# Patient Record
Sex: Male | Born: 1995 | Race: White | Hispanic: No | Marital: Single | Smoking: Never smoker
Health system: Southern US, Community
[De-identification: ages and names within clinical notes are randomized; demographics above are authoritative.]

---

## 2016-03-02 ENCOUNTER — Emergency Department
Admission: EM | Admit: 2016-03-02 | Discharge: 2016-03-02 | Disposition: A | Discharge status: Home / Self Care | Payer: Managed Care, Other (non HMO) | Attending: Emergency Medicine | Admitting: Emergency Medicine

## 2016-03-02 ENCOUNTER — Encounter: Payer: Self-pay | Admitting: Emergency Medicine

## 2016-03-02 ENCOUNTER — Emergency Department: Payer: Managed Care, Other (non HMO)

## 2016-03-02 DIAGNOSIS — R0602 Shortness of breath: Secondary | ICD-10-CM | POA: Diagnosis present

## 2016-03-02 DIAGNOSIS — J069 Acute upper respiratory infection, unspecified: Secondary | ICD-10-CM | POA: Diagnosis not present

## 2016-03-02 DIAGNOSIS — J982 Interstitial emphysema: Secondary | ICD-10-CM | POA: Diagnosis not present

## 2016-03-02 LAB — CBC WITH DIFFERENTIAL/PLATELET
BASOS ABS: 0.1 10*3/uL (ref 0–0.1)
Basophils Relative: 1 %
EOS ABS: 0.1 10*3/uL (ref 0–0.7)
EOS PCT: 1 %
HCT: 45.4 % (ref 40.0–52.0)
Hemoglobin: 15.3 g/dL (ref 13.0–18.0)
LYMPHS PCT: 5 %
Lymphs Abs: 0.7 10*3/uL — ABNORMAL LOW (ref 1.0–3.6)
MCH: 31 pg (ref 26.0–34.0)
MCHC: 33.8 g/dL (ref 32.0–36.0)
MCV: 91.8 fL (ref 80.0–100.0)
Monocytes Absolute: 0.8 10*3/uL (ref 0.2–1.0)
Monocytes Relative: 6 %
NEUTROS PCT: 87 %
Neutro Abs: 12 10*3/uL — ABNORMAL HIGH (ref 1.4–6.5)
PLATELETS: 207 10*3/uL (ref 150–440)
RBC: 4.95 MIL/uL (ref 4.40–5.90)
RDW: 12.6 % (ref 11.5–14.5)
WBC: 13.7 10*3/uL — AB (ref 3.8–10.6)

## 2016-03-02 LAB — POCT RAPID STREP A: STREPTOCOCCUS, GROUP A SCREEN (DIRECT): NEGATIVE

## 2016-03-02 LAB — BASIC METABOLIC PANEL
ANION GAP: 9 (ref 5–15)
BUN: 9 mg/dL (ref 6–20)
CO2: 24 mmol/L (ref 22–32)
Calcium: 9.1 mg/dL (ref 8.9–10.3)
Chloride: 102 mmol/L (ref 101–111)
Creatinine, Ser: 0.79 mg/dL (ref 0.61–1.24)
Glucose, Bld: 102 mg/dL — ABNORMAL HIGH (ref 65–99)
POTASSIUM: 3.8 mmol/L (ref 3.5–5.1)
SODIUM: 135 mmol/L (ref 135–145)

## 2016-03-02 MED ORDER — ALBUTEROL SULFATE HFA 108 (90 BASE) MCG/ACT IN AERS: 1.0000 | INHALATION_SPRAY | Freq: Four times a day (QID) | RESPIRATORY_TRACT | 0 refills | Status: AC | PRN

## 2016-03-02 MED ORDER — AZITHROMYCIN 250 MG PO TABS: ORAL_TABLET | ORAL | 0 refills | Status: AC

## 2016-03-02 MED ORDER — IPRATROPIUM-ALBUTEROL 0.5-2.5 (3) MG/3ML IN SOLN
3.0000 mL | Freq: Once | RESPIRATORY_TRACT | Status: AC
  Administered 2016-03-02: 3 mL via RESPIRATORY_TRACT
  Filled 2016-03-02: qty 3

## 2016-03-02 NOTE — ED Notes (Signed)
See triage note  States he has not felt well for about 3 days ..nasal congestion for couple of days   Then woke up with some chest congestion this am   Feels like it is hard to breath.. No resp distress noted on arrival  Afebrile on arrival

## 2016-03-02 NOTE — ED Provider Notes (Signed)
[facility] Emergency Department Provider Note  ____________________________________________  Time seen: Approximately 2:24 PM  I have reviewed the triage vital signs and the nursing notes.   HISTORY  Chief Complaint Facial Pain    HPI Timothy Wong is a 20 y.o. male, NAD, presents to the emergency department via EMS with 3 day history of nasal congestion, sinus pressure, sore throat, wheezing and shortness of breath. States he went to a [facility] to be evaluated for the symptoms this morning. Blood pressure was noted to be low therefore EMS was called. Patient was normotensive when EMS arrived but they continue to place and IV and the left antecubital fossa and transport the patient to the emergency department for further evaluation. Symptoms initially began with sinus pressure and nasal congestion on Saturday and seemed to worsen overnight. Has tried taking sudafed and mucinex with some relief of symptoms but no resolution. Last dose of sudafed was last night. He noticed that he started coughing more last night productive of clear sputum. Started to notice the sore throat this morning, which is irritated by PO intake, but hurts most with coughing. He also reports general fatigue, chest tightness, wheezing, shortness of breath, and trouble sleeping due to symptoms. He has not had a flu shot this year. When asked about sick contacts states, "There's been something going around campus". Patient does not have a history of asthma or seasonal allergies, although does admit the change in weather sometimes causes him to have upper respiratory symptoms. Patient denies abdominal pain, nausea, vomiting, diarrhea. No fevers, chills. No rashes.   History reviewed. No pertinent past medical history.  There are no active problems to display for this patient.   History reviewed. No pertinent surgical history.  Prior to Admission medications   Medication Sig Start Date End  Date Taking? Authorizing Provider  albuterol (PROVENTIL HFA;VENTOLIN HFA) 108 (90 Base) MCG/ACT inhaler Inhale 1-2 puffs into the lungs every 6 (six) hours as needed for wheezing or shortness of breath. 03/02/16   Jami L Hagler, PA-C  azithromycin (ZITHROMAX Z-PAK) 250 MG tablet Take 2 tablets (500 mg) on  Day 1,  followed by 1 tablet (250 mg) once daily on Days 2 through 5. 03/02/16   Jami L Hagler, PA-C    Allergies Review of patient's allergies indicates no known allergies.  No family history on file.  Social History Social History  Substance Use Topics  . Smoking status: Never Smoker  . Smokeless tobacco: Not on file  . Alcohol use Yes     Review of Systems  Constitutional: No fever/chills. Eyes: No visual changes. No discharge ENT: Positive sore throat, nasal congestion, rhinorrhea, sinus pressure. Negative for tinnitus or ear pain.  Cardiovascular: No chest pressure or palpitations. Respiratory: Positive for cough, shortness of breath, wheezing and chest tightness. Gastrointestinal: No abdominal pain.  No nausea, vomiting.  Musculoskeletal: Negative for neck/back pain or muscle aches.  Skin: Negative for rash. Neurological: Negative for headaches, focal weakness or numbness.   ____________________________________________   PHYSICAL EXAM:  VITAL SIGNS: ED Triage Vitals  Enc Vitals Group     BP 03/02/16 1358 132/88     Pulse Rate 03/02/16 1358 (!) 107     Resp 03/02/16 1358 20     Temp 03/02/16 1358 98.1 F (36.7 C)     Temp Source 03/02/16 1358 Oral     SpO2 03/02/16 1358 95 %     Weight 03/02/16 1400 125 lb (56.7 kg)  Height 03/02/16 1400 5\' 5"  (1.651 m)     Head Circumference --      Peak Flow --      Pain Score 03/02/16 1400 4     Pain Loc --      Pain Edu? --      Excl. in GC? --      Constitutional: Alert and oriented. Well appearing and in no acute distress. Eyes: Conjunctivae are normal Without icterus or injection.   Head: Atraumatic. ENT:       Ears: Canals and TMs normal bilaterally.       Nose: No congestion/rhinnorhea visible currently.      Mouth/Throat: Mucous membranes are moist. Pharynx without erythema, swelling, exudate. Clear postnasal drip. Uvula is midline and not edematous. Airway is patent. Neck: No stridor. No cervical spine tenderness to palpation. Supple, full ROM without pain or difficulty. Hematological/Lymphatic/Immunilogical: No preauricular, occipital, cervical, submandibular, submental, or supraclavicular lymphadenopathy.  Cardiovascular: Normal rate, regular rhythm. Normal S1 and S2.  Good peripheral circulation. Respiratory: Normal respiratory effort without tachypnea or retractions. Moderate wheeze noted throughout the upper and middle lung fields. No rales or rhonchi. Gastrointestinal: Soft and nontender without distention or guarding in all quadrants. No rebound or rigidity. Musculoskeletal: Full ROM in all extremities without pain or difficulty.  Neurologic:  Normal speech and language. No gross focal neurologic deficits are appreciated.  Skin:  Skin is warm, dry and intact. No rash noted. Psychiatric: Mood and affect are normal. Speech and behavior are normal. Patient exhibits appropriate insight and judgement.   ____________________________________________   LABS (all labs ordered are listed, but only abnormal results are displayed)  Labs Reviewed  BASIC METABOLIC PANEL - Abnormal; Notable for the following:       Result Value   Glucose, Bld 102 (*)    All other components within normal limits  CBC WITH DIFFERENTIAL/PLATELET - Abnormal; Notable for the following:    WBC 13.7 (*)    Neutro Abs 12.0 (*)    Lymphs Abs 0.7 (*)    All other components within normal limits  POCT RAPID STREP A   ____________________________________________  EKG  None ____________________________________________  RADIOLOGY I, Ernestene Kiel Hagler, personally viewed and evaluated these images (plain radiographs) as  part of my medical decision making, as well as reviewing the written report by the radiologist.  Dg Chest 2 View  Result Date: 03/02/2016 CLINICAL DATA:  Wheezing EXAM: CHEST  2 VIEW COMPARISON:  None. FINDINGS: Cardiac shadow is within normal limits. There are changes consistent with pneumomediastinum and subcutaneous emphysema extending into the neck. No definitive pneumothorax is seen. No focal infiltrate or sizable effusion is seen. No bony abnormality is noted. IMPRESSION: Pneumomediastinum extending into the neck. This may be related to barotrauma. Correlation to clinical history is recommended. Critical Value/emergent results were called by telephone at the time of interpretation on 03/02/2016 at 3:05 pm to [facility], PA , who verbally acknowledged these results. Electronically Signed   By: Alcide Clever M.D.   On: 03/02/2016 15:08    ____________________________________________    PROCEDURES  Procedure(s) performed: None   Procedures   Medications  ipratropium-albuterol (DUONEB) 0.5-2.5 (3) MG/3ML nebulizer solution 3 mL (3 mLs Nebulization Given 03/02/16 1535)     ____________________________________________   INITIAL IMPRESSION / ASSESSMENT AND PLAN / ED COURSE  Pertinent labs & imaging results that were available during my care of the patient were reviewed by me and considered in my medical decision making (see chart for details).  Clinical Course  Comment By Time  I spoke with Dr. Shaune PollackLord in regards to the patient's history, physical exam, imaging and laboratory results. We will discharge him home on antibiotics and albuterol inhaler to cover for potential bacterial upper respiratory infection. Patient notes significant improvement in breathing as well as less wheezing after DuoNeb. Respiratory exam still with mild wheezing in the upper lung fields, which is improved from initial exam, but no rhonchi or rales. Hope PigeonJami L Hagler, PA-C 10/02 1629    Patient's diagnosis is  consistent with Upper respiratory infection and pneumomediastinum. Patient will be discharged home with prescriptions for azithromycin and albuterol inhaler to use as directed. [facility]. Patient is given ED precautions to return to the ED for any worsening or new symptoms. Patient verbalizes understanding that he is return to the emergency Department if any worsening of symptoms or any new symptoms arise.    ____________________________________________  FINAL CLINICAL IMPRESSION(S) / ED DIAGNOSES  Final diagnoses:  Upper respiratory tract infection, unspecified type  Pneumomediastinum (HCC)      NEW MEDICATIONS STARTED DURING THIS VISIT:  Discharge Medication List as of 03/02/2016  4:32 PM    START taking these medications   Details  albuterol (PROVENTIL HFA;VENTOLIN HFA) 108 (90 Base) MCG/ACT inhaler Inhale 1-2 puffs into the lungs every 6 (six) hours as needed for wheezing or shortness of breath., Starting Mon 03/02/2016, Print    azithromycin (ZITHROMAX Z-PAK) 250 MG tablet Take 2 tablets (500 mg) on  Day 1,  followed by 1 tablet (250 mg) once daily on Days 2 through 5., Print            Hope PigeonJami L Hagler, PA-C 03/02/16 1752    Governor Rooksebecca Lord, MD 03/02/16 2042

## 2016-03-02 NOTE — ED Triage Notes (Signed)
Sinus congestion with clear drainage x 2 days, [facility] and was sent here via EMS due to their getting a low blood pressure. Pt was normotensive for EMS. Pt does have IV line started by EMS. Pt is alert, pink, warm and dry.

## 2016-03-02 NOTE — Discharge Instructions (Signed)
Please [facility] for [facility] in [facility].   If you have any worsening or onset of new symptoms, return to the emergency department.   Please read your exit care instructions.

## 2018-05-06 IMAGING — CR DG CHEST 2V
1 series · 2 of 2 positions shown · non-contrast
Comparison: None.

CLINICAL DATA: Wheezing

EXAM:
CHEST  2 VIEW

[Series 1: w chest pa · 0.14mm/px · 2 of 2 slices shown]
[im 1/2]
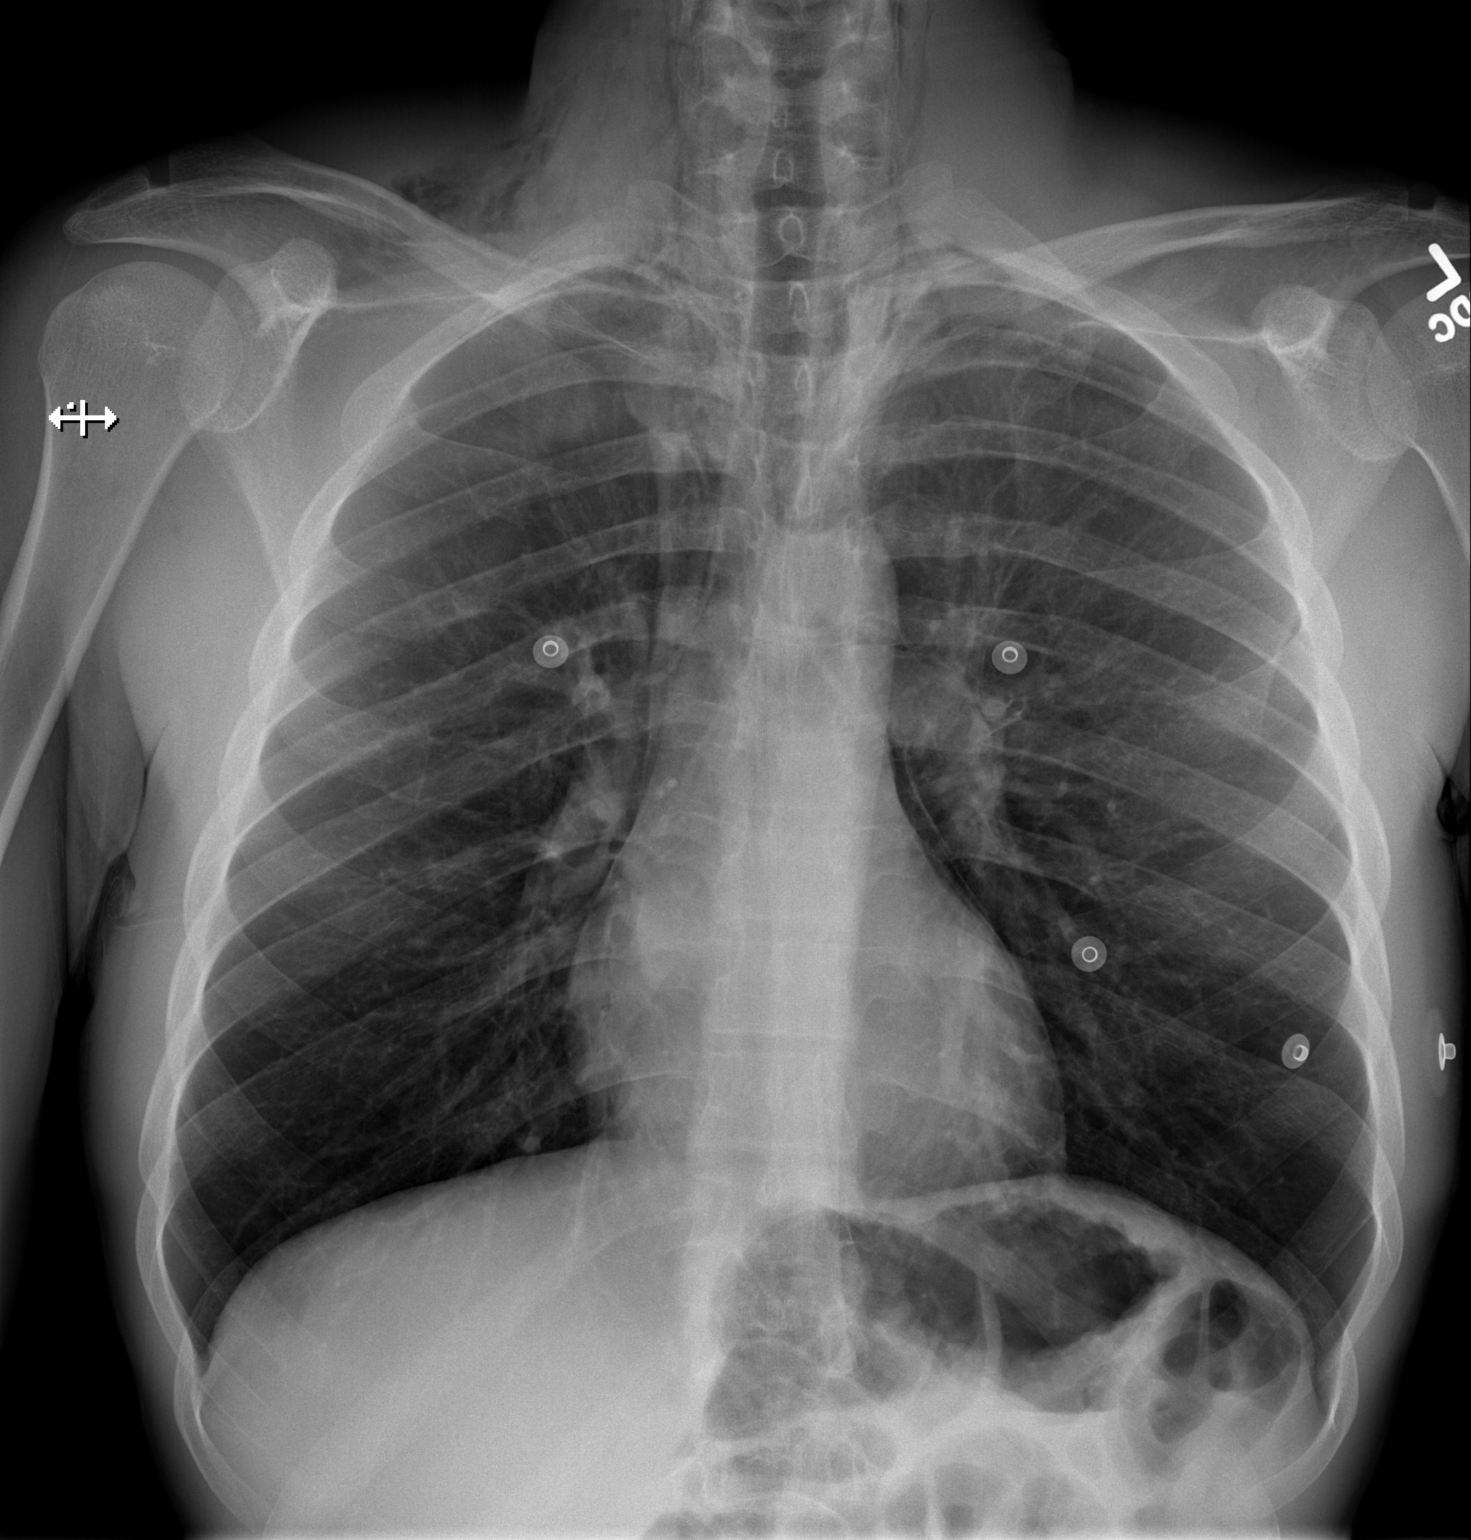
[im 2/2]
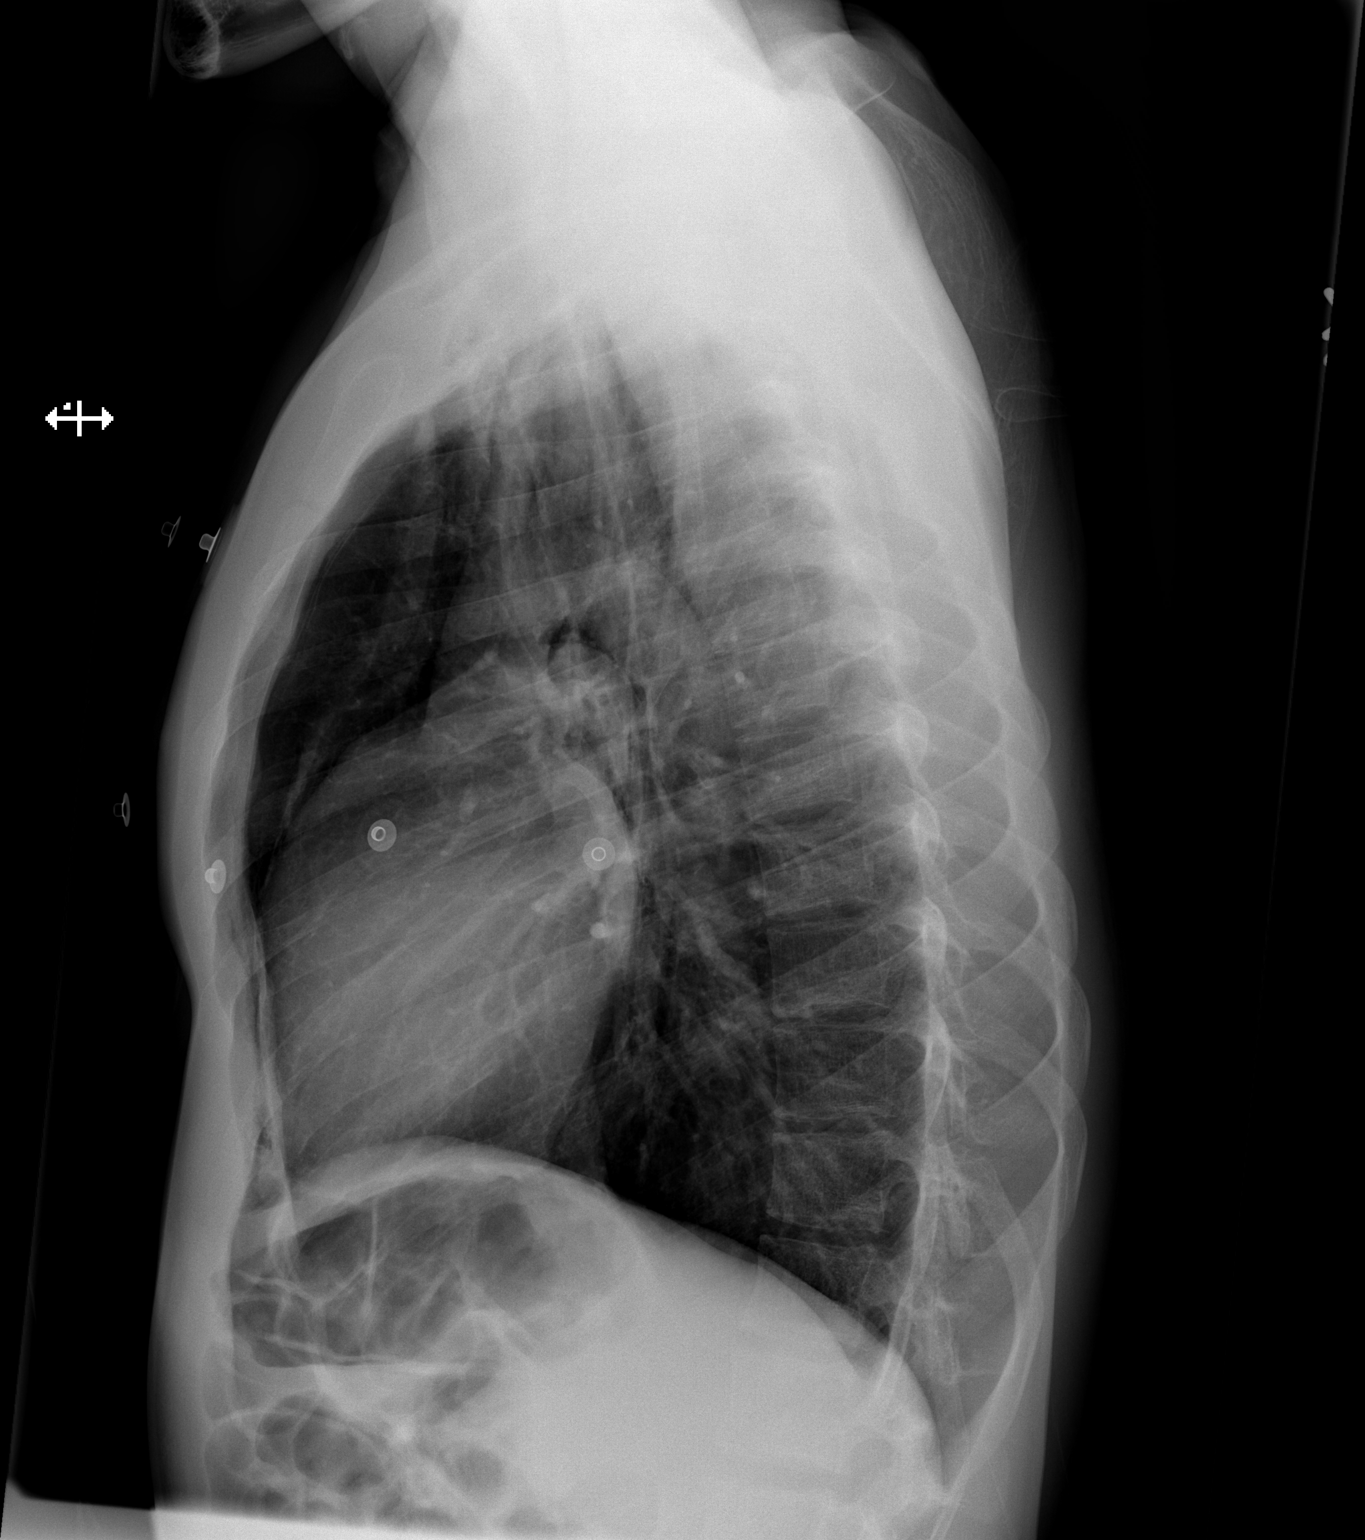

[2 of 2 positions shown; findings below may reference images not displayed]

FINDINGS: Cardiac shadow is within normal limits. There are changes consistent
with pneumomediastinum and subcutaneous emphysema extending into the
neck. No definitive pneumothorax is seen. No focal infiltrate or
sizable effusion is seen. No bony abnormality is noted.
IMPRESSION: Pneumomediastinum extending into the neck. This may be related to
barotrauma. Correlation to clinical history is recommended.

Critical Value/emergent results were called by telephone at the time
of interpretation on 03/02/2016 at [DATE] to LIMAU CEK, PA , who
verbally acknowledged these results.
